# Patient Record
Sex: Male | Born: 2000 | Race: White | Hispanic: Yes | Marital: Single | Smoking: Never smoker
Health system: Southern US, Community
[De-identification: ages and names within clinical notes are randomized; demographics above are authoritative.]

## PROBLEM LIST (undated history)

## (undated) DIAGNOSIS — L83 Acanthosis nigricans: Secondary | ICD-10-CM

## (undated) DIAGNOSIS — J45990 Exercise induced bronchospasm: Secondary | ICD-10-CM

## (undated) DIAGNOSIS — T7840XA Allergy, unspecified, initial encounter: Secondary | ICD-10-CM

## (undated) HISTORY — DX: Allergy, unspecified, initial encounter: T78.40XA

## (undated) HISTORY — DX: Acanthosis nigricans: L83

---
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
## 2022-08-22 DIAGNOSIS — I10 Essential (primary) hypertension: Secondary | ICD-10-CM | POA: Diagnosis not present

## 2022-08-22 DIAGNOSIS — R519 Headache, unspecified: Secondary | ICD-10-CM | POA: Diagnosis not present

## 2022-08-22 DIAGNOSIS — E669 Obesity, unspecified: Secondary | ICD-10-CM | POA: Diagnosis not present

## 2022-08-22 DIAGNOSIS — Z6838 Body mass index (BMI) 38.0-38.9, adult: Secondary | ICD-10-CM | POA: Diagnosis not present

## 2022-12-17 DIAGNOSIS — J020 Streptococcal pharyngitis: Secondary | ICD-10-CM | POA: Diagnosis not present

## 2022-12-19 DIAGNOSIS — R319 Hematuria, unspecified: Secondary | ICD-10-CM | POA: Diagnosis not present

## 2022-12-19 DIAGNOSIS — Z113 Encounter for screening for infections with a predominantly sexual mode of transmission: Secondary | ICD-10-CM | POA: Diagnosis not present

## 2022-12-19 DIAGNOSIS — F17290 Nicotine dependence, other tobacco product, uncomplicated: Secondary | ICD-10-CM | POA: Diagnosis not present

## 2023-01-22 DIAGNOSIS — J209 Acute bronchitis, unspecified: Secondary | ICD-10-CM | POA: Diagnosis not present

## 2023-01-22 DIAGNOSIS — B2790 Infectious mononucleosis, unspecified without complication: Secondary | ICD-10-CM | POA: Diagnosis not present

## 2023-01-22 DIAGNOSIS — J101 Influenza due to other identified influenza virus with other respiratory manifestations: Secondary | ICD-10-CM | POA: Diagnosis not present

## 2023-01-22 DIAGNOSIS — Z6838 Body mass index (BMI) 38.0-38.9, adult: Secondary | ICD-10-CM | POA: Diagnosis not present

## 2023-01-22 DIAGNOSIS — U071 COVID-19: Secondary | ICD-10-CM | POA: Diagnosis not present

## 2023-01-22 DIAGNOSIS — J9801 Acute bronchospasm: Secondary | ICD-10-CM | POA: Diagnosis not present

## 2023-01-22 DIAGNOSIS — J189 Pneumonia, unspecified organism: Secondary | ICD-10-CM | POA: Diagnosis not present

## 2023-01-22 DIAGNOSIS — R051 Acute cough: Secondary | ICD-10-CM | POA: Diagnosis not present

## 2023-01-22 DIAGNOSIS — R509 Fever, unspecified: Secondary | ICD-10-CM | POA: Diagnosis not present

## 2023-01-22 DIAGNOSIS — R070 Pain in throat: Secondary | ICD-10-CM | POA: Diagnosis not present

## 2023-01-22 DIAGNOSIS — E669 Obesity, unspecified: Secondary | ICD-10-CM | POA: Diagnosis not present

## 2023-02-23 DIAGNOSIS — F17290 Nicotine dependence, other tobacco product, uncomplicated: Secondary | ICD-10-CM | POA: Diagnosis not present

## 2023-02-23 DIAGNOSIS — J029 Acute pharyngitis, unspecified: Secondary | ICD-10-CM | POA: Diagnosis not present

## 2023-02-23 DIAGNOSIS — J028 Acute pharyngitis due to other specified organisms: Secondary | ICD-10-CM | POA: Diagnosis not present

## 2023-02-28 DIAGNOSIS — J039 Acute tonsillitis, unspecified: Secondary | ICD-10-CM | POA: Diagnosis not present

## 2023-02-28 DIAGNOSIS — R221 Localized swelling, mass and lump, neck: Secondary | ICD-10-CM | POA: Diagnosis not present

## 2023-03-24 DIAGNOSIS — R051 Acute cough: Secondary | ICD-10-CM | POA: Diagnosis not present

## 2023-03-24 DIAGNOSIS — J029 Acute pharyngitis, unspecified: Secondary | ICD-10-CM | POA: Diagnosis not present

## 2023-07-13 DIAGNOSIS — H6691 Otitis media, unspecified, right ear: Secondary | ICD-10-CM | POA: Diagnosis not present

## 2023-07-13 DIAGNOSIS — M7711 Lateral epicondylitis, right elbow: Secondary | ICD-10-CM | POA: Diagnosis not present

## 2023-07-13 DIAGNOSIS — R0981 Nasal congestion: Secondary | ICD-10-CM | POA: Diagnosis not present

## 2023-07-13 DIAGNOSIS — R519 Headache, unspecified: Secondary | ICD-10-CM | POA: Diagnosis not present

## 2023-07-17 DIAGNOSIS — G5601 Carpal tunnel syndrome, right upper limb: Secondary | ICD-10-CM | POA: Diagnosis not present

## 2023-11-09 DIAGNOSIS — Z6838 Body mass index (BMI) 38.0-38.9, adult: Secondary | ICD-10-CM | POA: Diagnosis not present

## 2023-11-09 DIAGNOSIS — U071 COVID-19: Secondary | ICD-10-CM | POA: Diagnosis not present

## 2023-11-09 DIAGNOSIS — R509 Fever, unspecified: Secondary | ICD-10-CM | POA: Diagnosis not present

## 2023-11-09 DIAGNOSIS — E669 Obesity, unspecified: Secondary | ICD-10-CM | POA: Diagnosis not present

## 2023-12-28 DIAGNOSIS — J020 Streptococcal pharyngitis: Secondary | ICD-10-CM | POA: Diagnosis not present

## 2023-12-28 DIAGNOSIS — J029 Acute pharyngitis, unspecified: Secondary | ICD-10-CM | POA: Diagnosis not present

## 2023-12-28 DIAGNOSIS — R030 Elevated blood-pressure reading, without diagnosis of hypertension: Secondary | ICD-10-CM | POA: Diagnosis not present

## 2023-12-28 DIAGNOSIS — E669 Obesity, unspecified: Secondary | ICD-10-CM | POA: Diagnosis not present

## 2023-12-28 DIAGNOSIS — Z6838 Body mass index (BMI) 38.0-38.9, adult: Secondary | ICD-10-CM | POA: Diagnosis not present

## 2024-01-27 DIAGNOSIS — T148XXA Other injury of unspecified body region, initial encounter: Secondary | ICD-10-CM | POA: Diagnosis not present

## 2024-01-27 DIAGNOSIS — F17290 Nicotine dependence, other tobacco product, uncomplicated: Secondary | ICD-10-CM | POA: Diagnosis not present

## 2024-01-27 DIAGNOSIS — S46811A Strain of other muscles, fascia and tendons at shoulder and upper arm level, right arm, initial encounter: Secondary | ICD-10-CM | POA: Diagnosis not present

## 2024-03-25 DIAGNOSIS — M545 Low back pain, unspecified: Secondary | ICD-10-CM | POA: Diagnosis not present

## 2024-03-25 DIAGNOSIS — R319 Hematuria, unspecified: Secondary | ICD-10-CM | POA: Diagnosis not present

## 2024-03-25 DIAGNOSIS — R109 Unspecified abdominal pain: Secondary | ICD-10-CM | POA: Diagnosis not present

## 2024-03-26 DIAGNOSIS — R109 Unspecified abdominal pain: Secondary | ICD-10-CM | POA: Diagnosis not present
